# Patient Record
Sex: Female | Born: 1935 | Race: White | Hispanic: No | Smoking: Never smoker
Health system: Southern US, Community
[De-identification: ages and names within clinical notes are randomized; demographics above are authoritative.]

## PROBLEM LIST (undated history)

## (undated) DIAGNOSIS — E7800 Pure hypercholesterolemia, unspecified: Secondary | ICD-10-CM

## (undated) DIAGNOSIS — M1990 Unspecified osteoarthritis, unspecified site: Secondary | ICD-10-CM

## (undated) DIAGNOSIS — H409 Unspecified glaucoma: Secondary | ICD-10-CM

## (undated) DIAGNOSIS — J302 Other seasonal allergic rhinitis: Secondary | ICD-10-CM

## (undated) DIAGNOSIS — I10 Essential (primary) hypertension: Secondary | ICD-10-CM

## (undated) DIAGNOSIS — H269 Unspecified cataract: Secondary | ICD-10-CM

## (undated) HISTORY — PX: FACIAL COSMETIC SURGERY: SHX629

---

## 2009-10-19 ENCOUNTER — Ambulatory Visit: Payer: Self-pay | Admitting: Family Medicine

## 2010-10-24 ENCOUNTER — Ambulatory Visit: Payer: Self-pay | Admitting: Family Medicine

## 2010-11-02 ENCOUNTER — Ambulatory Visit: Payer: Self-pay | Admitting: Family Medicine

## 2010-11-07 ENCOUNTER — Ambulatory Visit: Payer: Self-pay

## 2011-07-23 ENCOUNTER — Ambulatory Visit: Payer: Self-pay | Admitting: Internal Medicine

## 2011-07-23 IMAGING — CR RIGHT ANKLE - COMPLETE 3+ VIEW
1 series · 5 of 5 positions shown · non-contrast
Comparison: none

REASON FOR EXAM: pain with ambulation
COMMENTS:

PROCEDURE:     MDR - MDR ANKLE RIGHT COMPLETE  - [DATE] [DATE]
RESULT:     Comparison: None.

[Series 1: ap · 0.17mm/px · 5 of 5 slices shown]
[im 1/5]
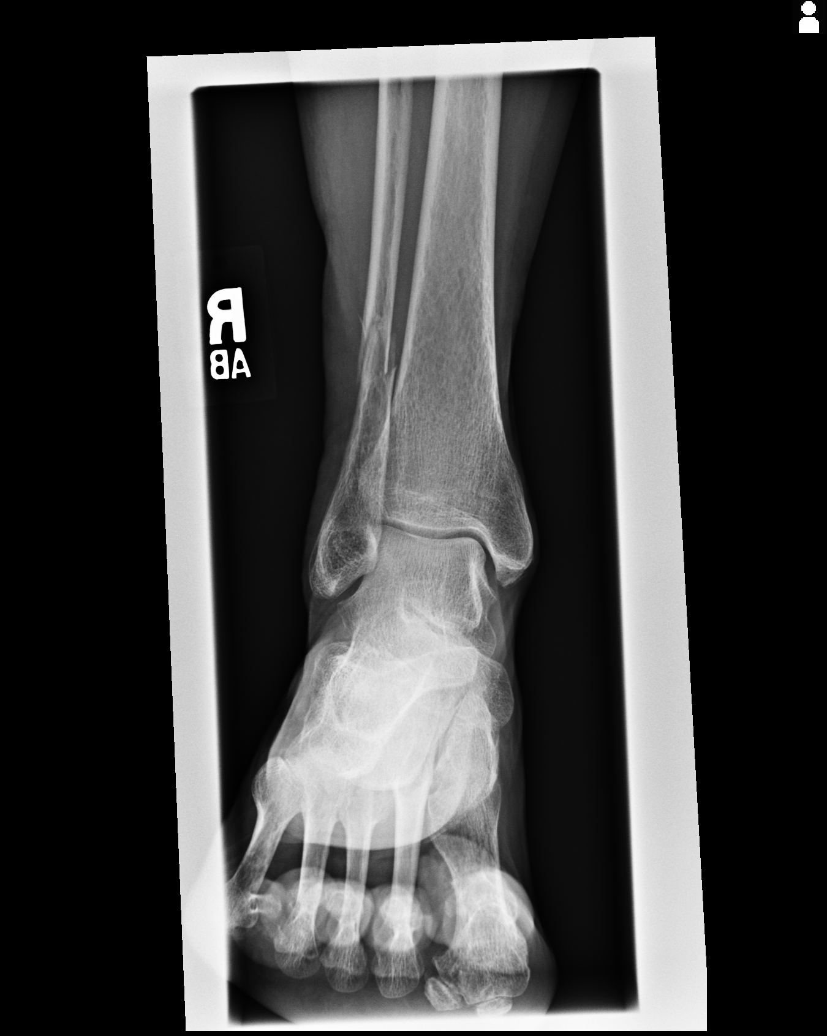
[im 2/5]
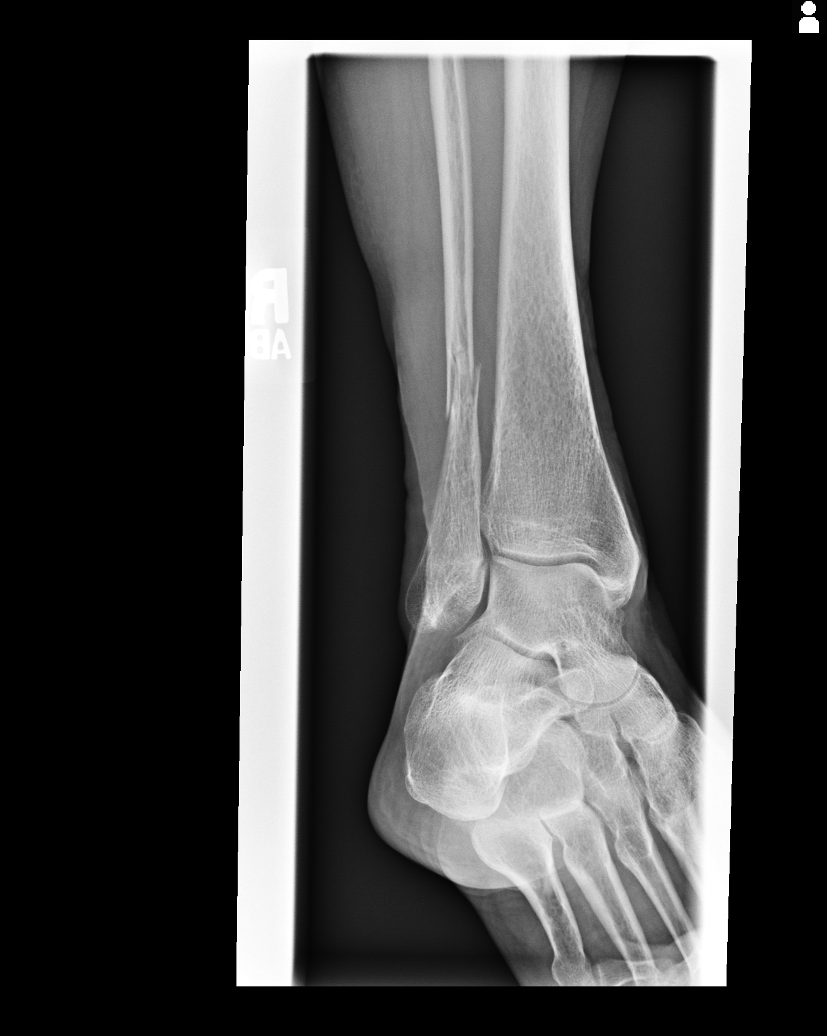
[im 3/5]
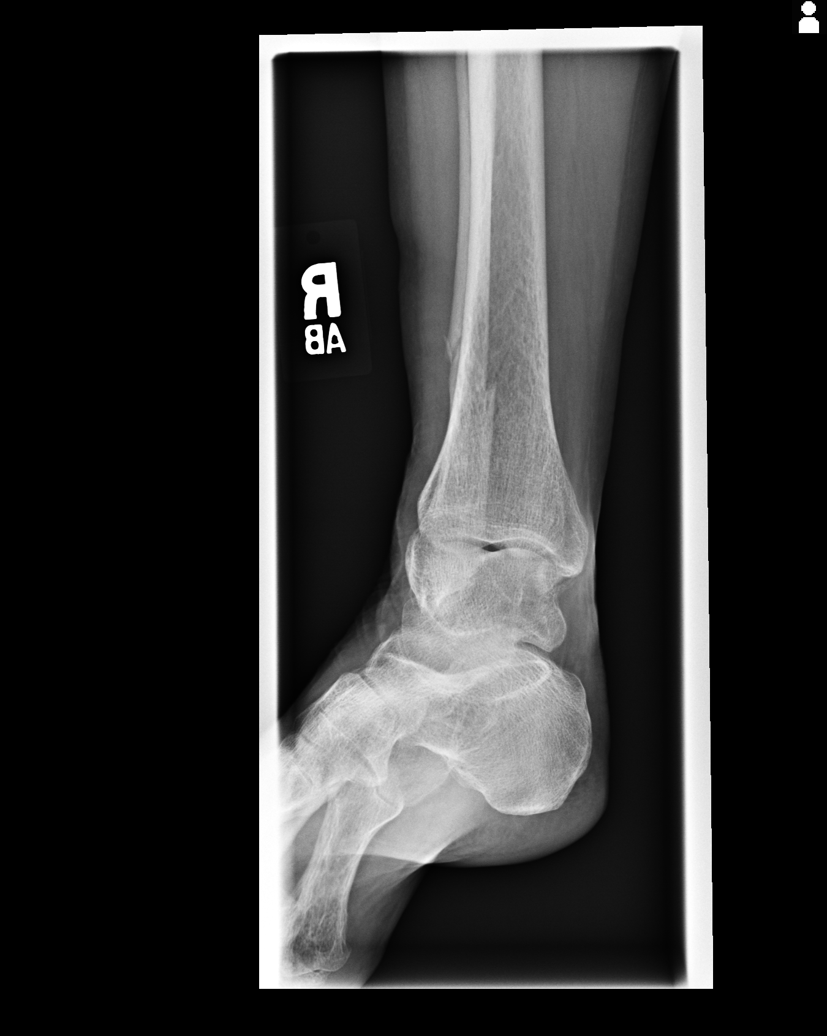
[im 4/5]
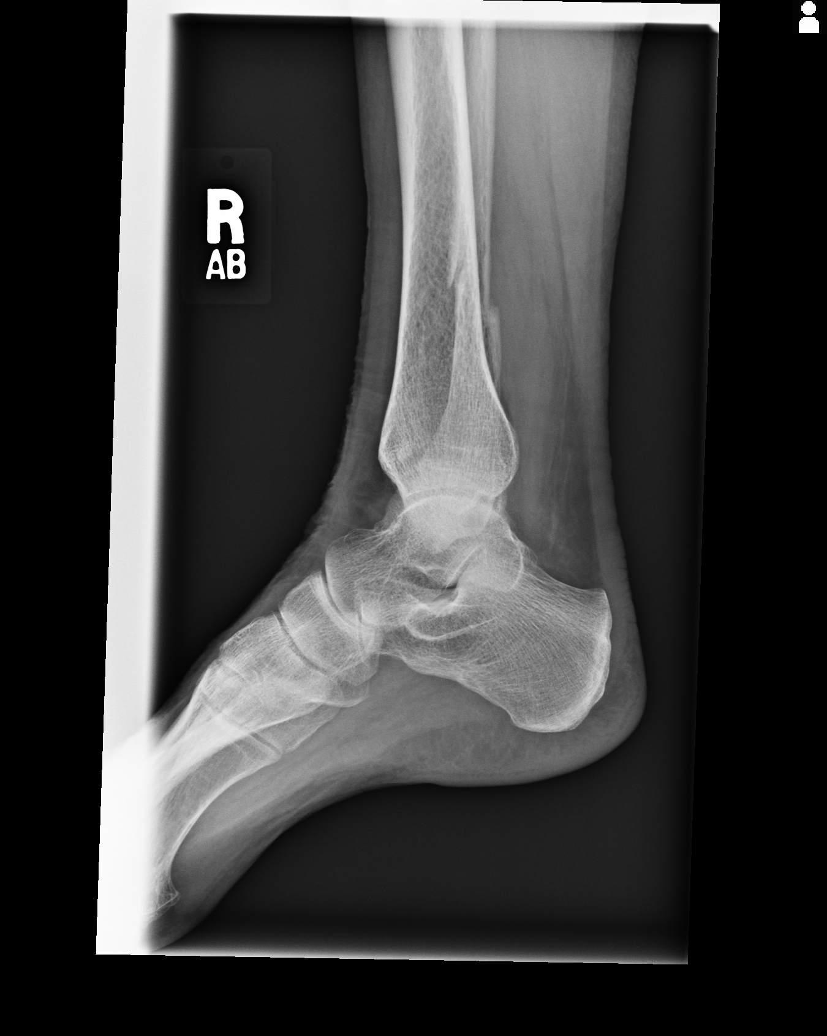
[im 5/5]
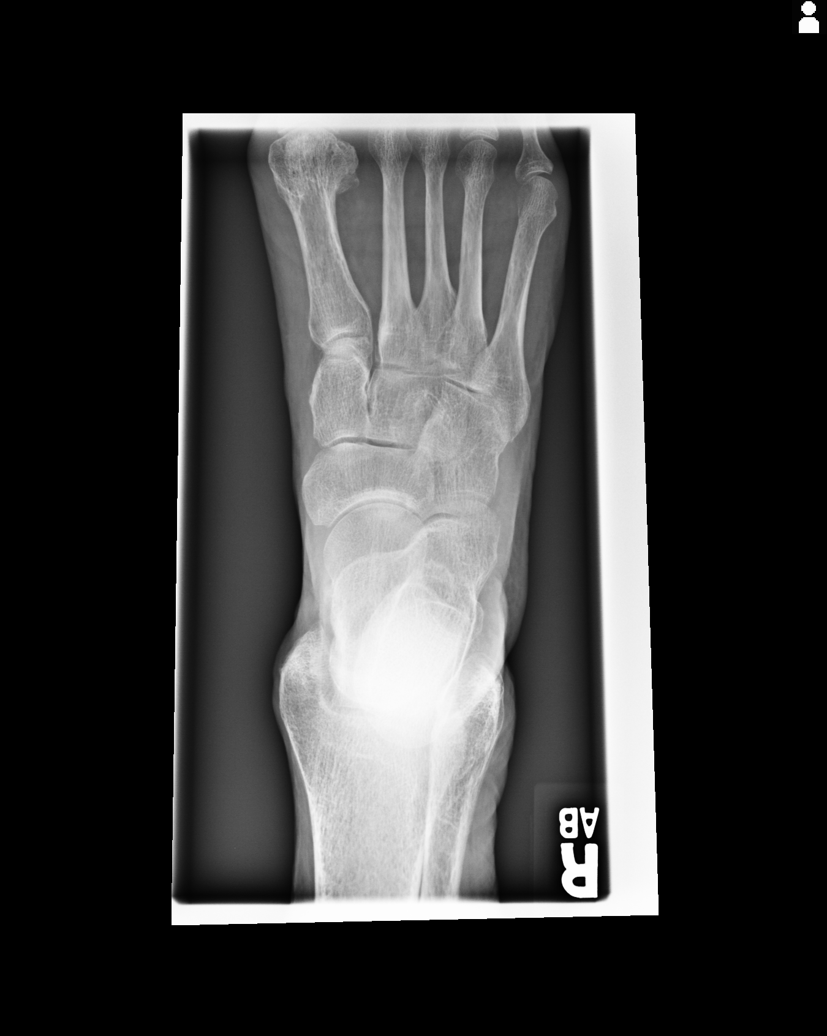

[5 of 5 positions shown; findings below may reference images not displayed]

FINDINGS: There is a mildly displaced fracture of the distal fibular diaphysis. No
fracture seen in the distal tibia.
IMPRESSION: Mildly displaced fracture of the distal fibula.

[REDACTED]

## 2011-11-22 ENCOUNTER — Ambulatory Visit: Payer: Self-pay | Admitting: Family Medicine

## 2011-11-22 IMAGING — MG MM DIGITAL SCREENING BILAT W/ CAD
1 series · 4 of 4 positions shown · non-contrast
Comparison: none

REASON FOR EXAM: SCR MAMMO NO ORDER
COMMENTS:

PROCEDURE:     MMM - MMM DGT SCR NO ORDER W/CAD  - November 22, 2011  [DATE]
RESULT:
Comparisons: 11/02/2010 and 10/19/2009.

[R CC · right · 4 of 4 slices shown]
[im 1/4]
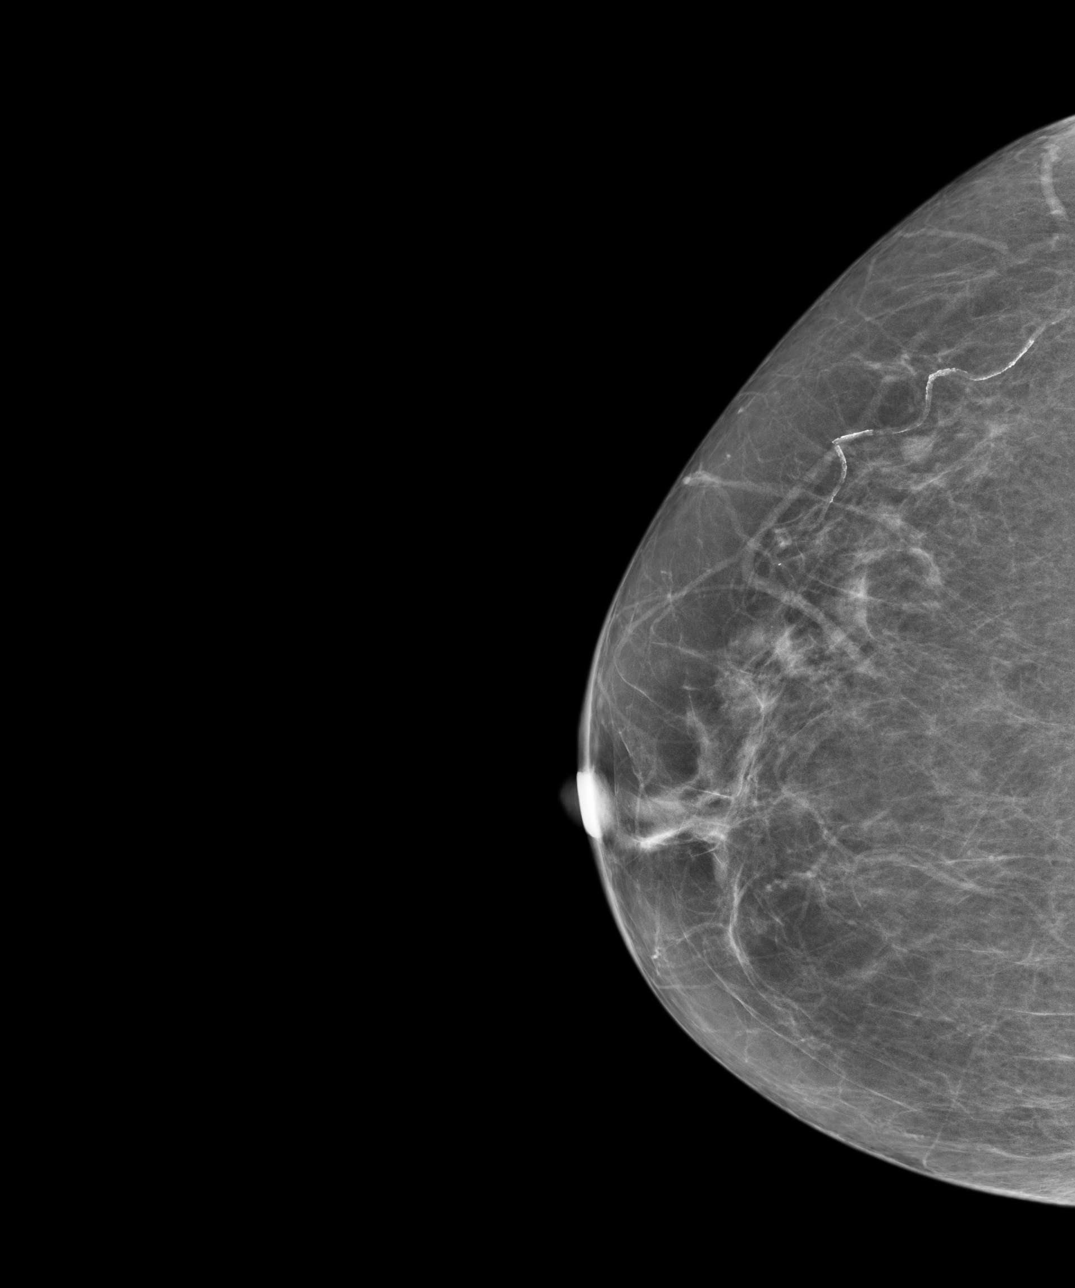
[im 2/4]
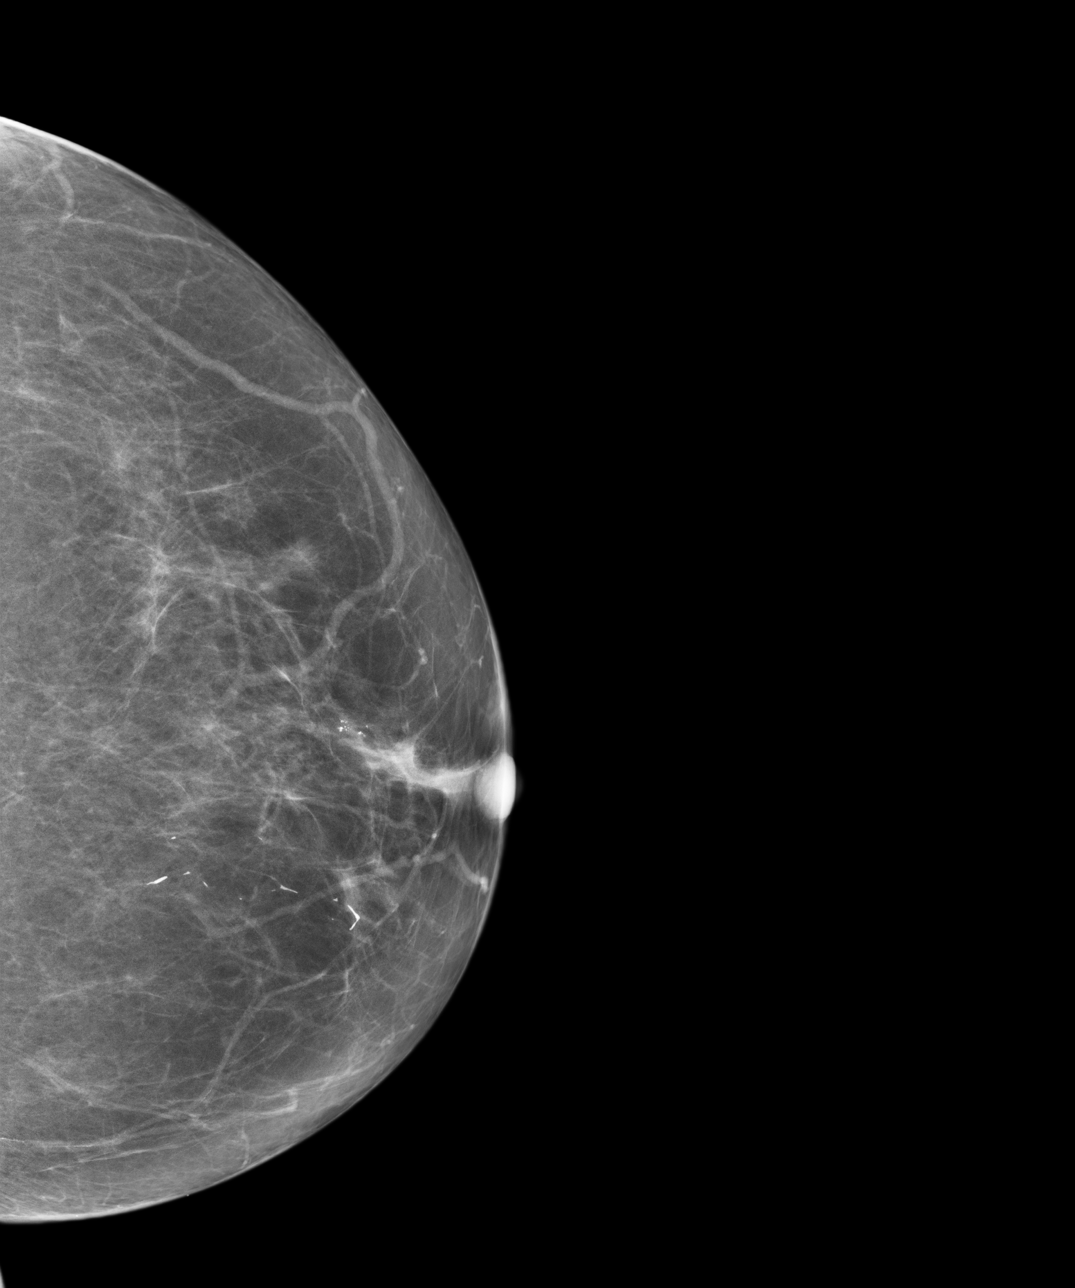
[im 3/4]
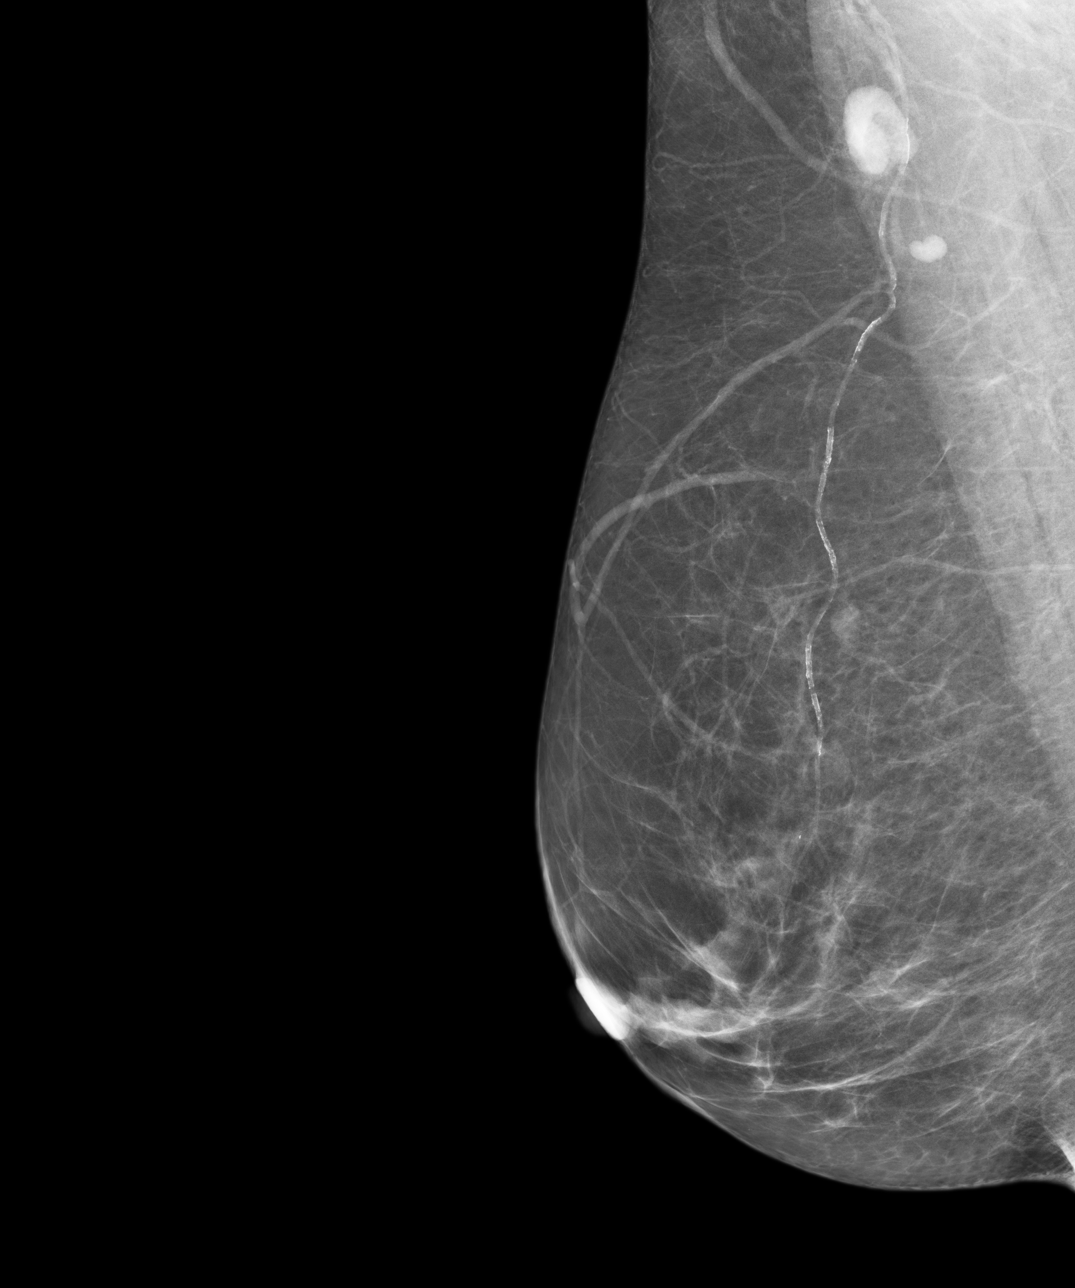
[im 4/4]
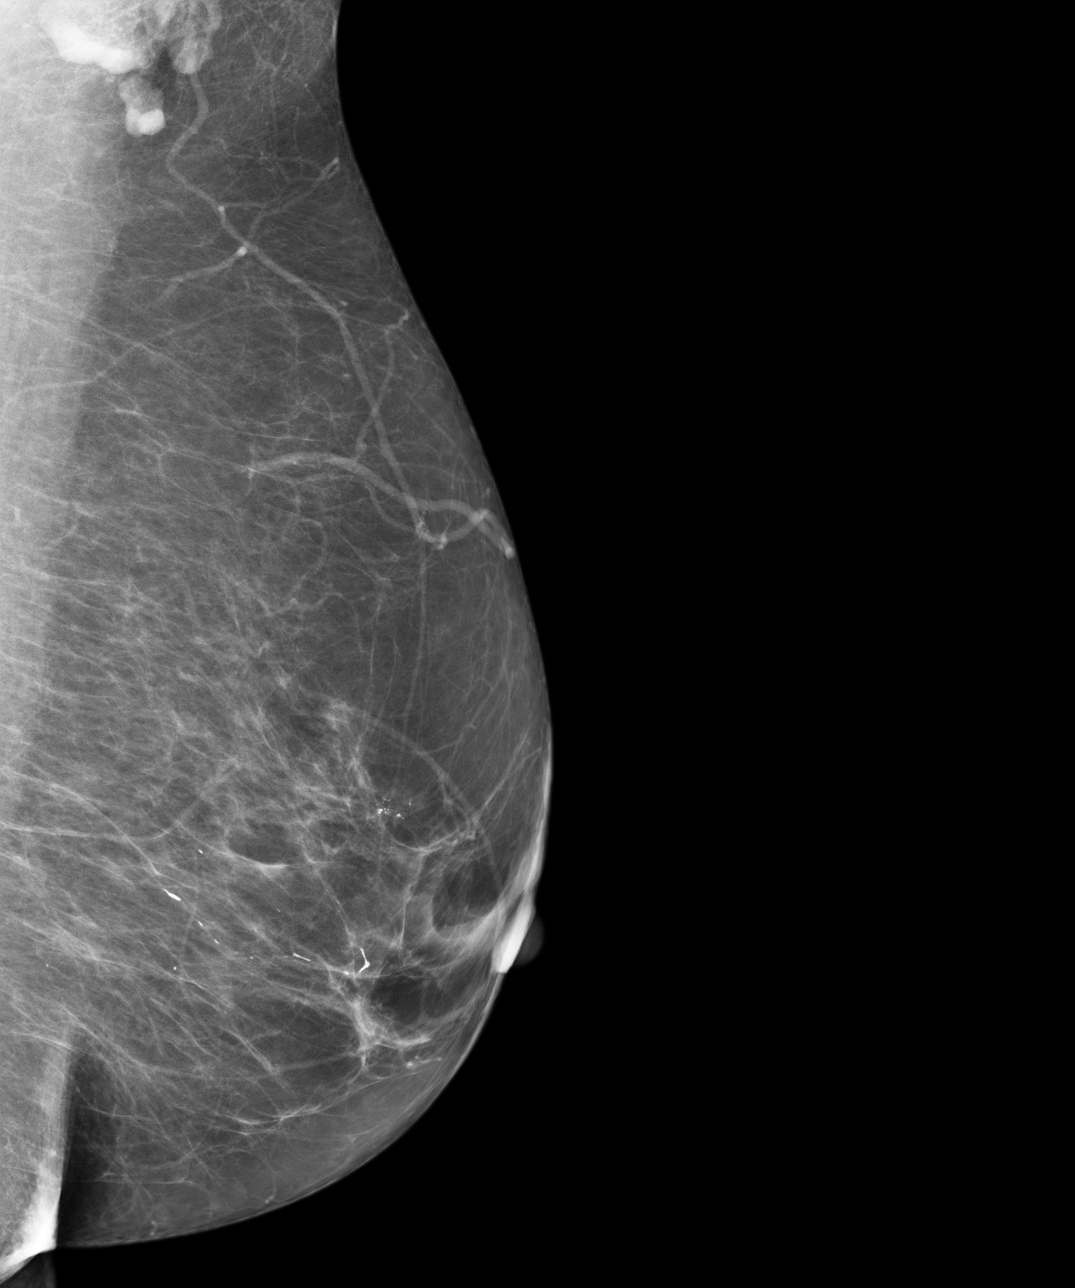

[4 of 4 positions shown; findings below may reference images not displayed]

FINDINGS: There is scattered fibroglandular tissue. Small round mass in the
superolateral aspect of the right breast is similar to prior studies and may
represent a small lymph node or cyst. Small cluster of round
microcalcifications in the superior aspect of the left breast is also
similar to multiple prior studies. No suspicious masses or calcifications
are identified.
IMPRESSION: BI-RADS: Category 2 - Benign Finding.

Recommend continued annual screening mammography.

A NEGATIVE MAMMOGRAM REPORT DOES NOT PRECLUDE BIOPSY OR OTHER EVALUATION OF
A CLINICALLY PALPABLE OR OTHERWISE SUSPICIOUS MASS OR LESION. BREAST CANCER
MAY NOT BE DETECTED BY MAMMOGRAPHY IN UP TO 10% OF CASES.

## 2013-01-22 ENCOUNTER — Ambulatory Visit: Payer: Self-pay | Admitting: Family Medicine

## 2014-05-30 ENCOUNTER — Other Ambulatory Visit: Payer: Self-pay | Admitting: Family Medicine

## 2014-05-30 DIAGNOSIS — Z1231 Encounter for screening mammogram for malignant neoplasm of breast: Secondary | ICD-10-CM

## 2014-06-09 ENCOUNTER — Ambulatory Visit
Admission: RE | Admit: 2014-06-09 | Discharge: 2014-06-09 | Disposition: A | Discharge status: Home / Self Care | Payer: Medicare Other | Source: Ambulatory Visit | Attending: Family Medicine | Admitting: Family Medicine

## 2014-06-09 DIAGNOSIS — Z1231 Encounter for screening mammogram for malignant neoplasm of breast: Secondary | ICD-10-CM | POA: Insufficient documentation

## 2014-08-12 ENCOUNTER — Other Ambulatory Visit: Payer: Self-pay | Admitting: Family Medicine

## 2014-08-12 DIAGNOSIS — Z780 Asymptomatic menopausal state: Secondary | ICD-10-CM

## 2014-08-12 DIAGNOSIS — E349 Endocrine disorder, unspecified: Secondary | ICD-10-CM

## 2015-01-20 ENCOUNTER — Ambulatory Visit
Admission: EM | Admit: 2015-01-20 | Discharge: 2015-01-20 | Disposition: A | Discharge status: Home / Self Care | Payer: Medicare Other | Attending: Family Medicine | Admitting: Family Medicine

## 2015-01-20 ENCOUNTER — Encounter: Payer: Self-pay | Admitting: *Deleted

## 2015-01-20 DIAGNOSIS — B354 Tinea corporis: Secondary | ICD-10-CM | POA: Diagnosis not present

## 2015-01-20 HISTORY — DX: Unspecified cataract: H26.9

## 2015-01-20 HISTORY — DX: Essential (primary) hypertension: I10

## 2015-01-20 HISTORY — DX: Unspecified glaucoma: H40.9

## 2015-01-20 MED ORDER — FLUCONAZOLE 150 MG PO TABS: ORAL_TABLET | ORAL | Status: DC

## 2015-01-20 MED ORDER — AMOXICILLIN 875 MG PO TABS: 875.0000 mg | ORAL_TABLET | Freq: Two times a day (BID) | ORAL | Status: DC

## 2015-01-20 NOTE — ED Notes (Addendum)
Patient noticed a rash developing on her upper chest yesterday. A red rash is visible above breast and on neck. No rash present on back arms face or legs. The rash is itchy. Patient has a history of skin issues. No SOB or throat swelling reported.

## 2015-01-20 NOTE — Discharge Instructions (Signed)

## 2015-01-20 NOTE — ED Provider Notes (Signed)
CSN: [id number]     Arrival date & time 01/20/15  1346 History   None    Chief Complaint  Patient presents with  . Rash   (Consider location/radiation/quality/duration/timing/severity/associated sxs/prior Treatment) Patient is a 81 y.o. female presenting with rash. The history is provided by the patient.  Rash Location:  Torso and head/neck Head/neck rash location:  L neck and R neck Torso rash location:  L chest and R chest Quality: itchiness and redness   Quality: not blistering, not bruising, not burning, not draining and not painful   Severity:  Moderate Onset quality:  Sudden Duration:  2 days Timing:  Constant Progression:  Worsening Chronicity:  New Context: not animal contact, not chemical exposure, not eggs, not exposure to similar rash, not hot tub use, not insect bite/sting, not medications, not new detergent/soap, not nuts, not plant contact, not sick contacts and not sun exposure   Relieved by:  Nothing Ineffective treatments:  Anti-itch cream Associated symptoms: no abdominal pain, no diarrhea, no fever, no headaches, no nausea, no periorbital edema, no shortness of breath, no sore throat, no throat swelling, no tongue swelling, no URI and not wheezing     Past Medical History  Diagnosis Date  . Hypertension   . Cataract   . Glaucoma    History reviewed. No pertinent past surgical history. History reviewed. No pertinent family history. Social History  Substance Use Topics  . Smoking status: Never Smoker   . Smokeless tobacco: Never Used  . Alcohol Use: Yes   OB History    No data available     Review of Systems  Constitutional: Negative for fever.  HENT: Negative for sore throat.   Respiratory: Negative for shortness of breath and wheezing.   Gastrointestinal: Negative for nausea, abdominal pain and diarrhea.  Skin: Positive for rash.  Neurological: Negative for headaches.    Allergies  Sulfa antibiotics  Home Medications   Prior to Admission  medications   Medication Sig Start Date End Date Taking? Authorizing Provider  lisinopril (PRINIVIL,ZESTRIL) 20 MG tablet Take 20 mg by mouth daily.   Yes Historical Provider, MD  amoxicillin (AMOXIL) 875 MG tablet Take 1 tablet (875 mg total) by mouth 2 (two) times daily. 01/20/15   Payton Mccallumrlando Shalae Belmonte, MD  fluconazole (DIFLUCAN) 150 MG tablet Take 1 tab po once today. May repeat in 1 week. 01/20/15   Payton Mccallumrlando Laelah Siravo, MD   Meds Ordered and Administered this Visit  Medications - No data to display  BP 169/74 mmHg  Pulse 85  Temp(Src) 98 F (36.7 C) (Oral)  Resp 20  Ht 5\' 3"  (1.6 m)  Wt 111 lb (50.349 kg)  BMI 19.67 kg/m2  SpO2 100% No data found.   Physical Exam  Constitutional: She appears well-developed and well-nourished. No distress.  Skin: Rash (diffuse, erythematous, macular, papular rash on anterior neck and upper chest area) noted. She is not diaphoretic.  Nursing note and vitals reviewed.   ED Course  Procedures (including critical care time)  Labs Review Labs Reviewed - No data to display  Imaging Review No results found.   Visual Acuity Review  Right Eye Distance:   Left Eye Distance:   Bilateral Distance:    Right Eye Near:   Left Eye Near:    Bilateral Near:         MDM   1. Tinea corporis    Discharge Medication List as of 01/20/2015  3:15 PM    START taking these medications   Details  fluconazole (DIFLUCAN) 150 MG tablet Take 1 tab po once today. May repeat in 1 week., Normal       1. diagnosis reviewed with patient 2. rx as per orders above; reviewed possible side effects, interactions, risks and benefits  3. Recommend supportive treatment with otc antifungal cream and otc anti-histamines prn 4. Follow-up prn if symptoms worsen or don't improve    Payton Mccallum, MD 01/20/15 1704

## 2015-03-10 ENCOUNTER — Encounter: Payer: Self-pay | Admitting: *Deleted

## 2015-03-13 ENCOUNTER — Encounter: Payer: Self-pay | Admitting: Anesthesiology

## 2015-03-20 ENCOUNTER — Ambulatory Visit: Admission: RE | Admit: 2015-03-20 | Payer: Medicare Other | Source: Ambulatory Visit | Admitting: Ophthalmology

## 2015-03-20 HISTORY — DX: Other seasonal allergic rhinitis: J30.2

## 2015-03-20 HISTORY — DX: Unspecified osteoarthritis, unspecified site: M19.90

## 2015-03-20 SURGERY — PHACOEMULSIFICATION, CATARACT, WITH IOL INSERTION
Anesthesia: Topical | Laterality: Right

## 2015-08-30 ENCOUNTER — Other Ambulatory Visit: Payer: Self-pay | Admitting: Family Medicine

## 2015-08-30 DIAGNOSIS — Z1231 Encounter for screening mammogram for malignant neoplasm of breast: Secondary | ICD-10-CM

## 2015-09-21 ENCOUNTER — Other Ambulatory Visit: Payer: Self-pay | Admitting: Family Medicine

## 2015-09-21 ENCOUNTER — Ambulatory Visit
Admission: RE | Admit: 2015-09-21 | Discharge: 2015-09-21 | Disposition: A | Discharge status: Home / Self Care | Payer: Medicare Other | Source: Ambulatory Visit | Attending: Family Medicine | Admitting: Family Medicine

## 2015-09-21 DIAGNOSIS — Z1231 Encounter for screening mammogram for malignant neoplasm of breast: Secondary | ICD-10-CM | POA: Diagnosis not present

## 2016-03-12 ENCOUNTER — Encounter: Payer: Self-pay | Admitting: *Deleted

## 2016-03-15 NOTE — Discharge Instructions (Signed)
Cataract Surgery, Care After °Refer to this sheet in the next few weeks. These instructions provide you with information about caring for yourself after your procedure. Your health care provider may also give you more specific instructions. Your treatment has been planned according to current medical practices, but problems sometimes occur. Call your health care provider if you have any problems or questions after your procedure. °What can I expect after the procedure? °After the procedure, it is common to have: °· Itching. °· Discomfort. °· Fluid discharge. °· Sensitivity to light and to touch. °· Bruising. °Follow these instructions at home: °Eye Care  °· Check your eye every day for signs of infection. Watch for: °¨ Redness, swelling, or pain. °¨ Fluid, blood, or pus. °¨ Warmth. °¨ Bad smell. °Activity  °· Avoid strenuous activities, such as playing contact sports, for as long as told by your health care provider. °· Do not drive or operate heavy machinery until your health care provider approves. °· Do not bend or lift heavy objects . Bending increases pressure in the eye. You can walk, climb stairs, and do light household chores. °· Ask your health care provider when you can return to work. If you work in a dusty environment, you may be advised to wear protective eyewear for a period of time. °General instructions  °· Take or apply over-the-counter and prescription medicines only as told by your health care provider. This includes eye drops. °· Do not touch or rub your eyes. °· If you were given a protective shield, wear it as told by your health care provider. If you were not given a protective shield, wear sunglasses as told by your health care provider to protect your eyes. °· Keep the area around your eye clean and dry. Avoid swimming or allowing water to hit you directly in the face while showering until told by your health care provider. Keep soap and shampoo out of your eyes. °· Do not put a contact lens  into the affected eye or eyes until your health care provider approves. °· Keep all follow-up visits as told by your health care provider. This is important. °Contact a health care provider if: ° °· You have increased bruising around your eye. °· You have pain that is not helped with medicine. °· You have a fever. °· You have redness, swelling, or pain in your eye. °· You have fluid, blood, or pus coming from your incision. °· Your vision gets worse. °Get help right away if: °· You have sudden vision loss. °This information is not intended to replace advice given to you by your health care provider. Make sure you discuss any questions you have with your health care provider. °Document Released: 07/13/2004 Document Revised: 05/04/2015 Document Reviewed: 11/03/2014 °Elsevier Interactive Patient Education © 2017 [employer] ° ° ° ° °General Anesthesia, Adult, Care After °These instructions provide you with information about caring for yourself after your procedure. Your health care provider may also give you more specific instructions. Your treatment has been planned according to current medical practices, but problems sometimes occur. Call your health care provider if you have any problems or questions after your procedure. °What can I expect after the procedure? °After the procedure, it is common to have: °· Vomiting. °· A sore throat. °· Mental slowness. °It is common to feel: °· Nauseous. °· Cold or shivery. °· Sleepy. °· Tired. °· Sore or achy, even in parts of your body where you did not have surgery. °Follow these instructions at   home: °For at least 24 hours after the procedure:  °· Do not: °¨ Participate in activities where you could fall or become injured. °¨ Drive. °¨ Use heavy machinery. °¨ Drink alcohol. °¨ Take sleeping pills or medicines that cause drowsiness. °¨ Make important decisions or sign legal documents. °¨ Take care of children on your own. °· Rest. °Eating and drinking  °· If you vomit, drink  water, juice, or soup when you can drink without vomiting. °· Drink enough fluid to keep your urine clear or pale yellow. °· Make sure you have little or no nausea before eating solid foods. °· Follow the diet recommended by your health care provider. °General instructions  °· Have a responsible adult stay with you until you are awake and alert. °· Return to your normal activities as told by your health care provider. Ask your health care provider what activities are safe for you. °· Take over-the-counter and prescription medicines only as told by your health care provider. °· If you smoke, do not smoke without supervision. °· Keep all follow-up visits as told by your health care provider. This is important. °Contact a health care provider if: °· You continue to have nausea or vomiting at home, and medicines are not helpful. °· You cannot drink fluids or start eating again. °· You cannot urinate after 8-12 hours. °· You develop a skin rash. °· You have fever. °· You have increasing redness at the site of your procedure. °Get help right away if: °· You have difficulty breathing. °· You have chest pain. °· You have unexpected bleeding. °· You feel that you are having a life-threatening or urgent problem. °This information is not intended to replace advice given to you by your health care provider. Make sure you discuss any questions you have with your health care provider. °Document Released: 04/01/2000 Document Revised: 05/29/2015 Document Reviewed: 12/08/2014 °Elsevier Interactive Patient Education © 2017 [employer] ° °

## 2016-03-18 ENCOUNTER — Ambulatory Visit: Payer: Medicare Other | Admitting: Anesthesiology

## 2016-03-18 ENCOUNTER — Encounter
Admission: RE | Disposition: A | Discharge status: Home / Self Care | Payer: Self-pay | Source: Ambulatory Visit | Attending: Ophthalmology

## 2016-03-18 ENCOUNTER — Ambulatory Visit
Admission: RE | Admit: 2016-03-18 | Discharge: 2016-03-18 | Disposition: A | Discharge status: Home / Self Care | Payer: Medicare Other | Source: Ambulatory Visit | Attending: Ophthalmology | Admitting: Ophthalmology

## 2016-03-18 DIAGNOSIS — H2512 Age-related nuclear cataract, left eye: Secondary | ICD-10-CM | POA: Diagnosis not present

## 2016-03-18 DIAGNOSIS — I10 Essential (primary) hypertension: Secondary | ICD-10-CM | POA: Diagnosis not present

## 2016-03-18 HISTORY — PX: CATARACT EXTRACTION W/PHACO: SHX586

## 2016-03-18 HISTORY — DX: Pure hypercholesterolemia, unspecified: E78.00

## 2016-03-18 SURGERY — PHACOEMULSIFICATION, CATARACT, WITH IOL INSERTION
Anesthesia: Monitor Anesthesia Care | Site: Eye | Laterality: Left | Wound class: Clean

## 2016-03-18 MED ORDER — ACETAMINOPHEN 325 MG PO TABS: 325.0000 mg | ORAL_TABLET | ORAL | Status: DC | PRN

## 2016-03-18 MED ORDER — BSS IO SOLN
INTRAOCULAR | Status: DC | PRN
  Administered 2016-03-18: 104 mL via OPHTHALMIC

## 2016-03-18 MED ORDER — BRIMONIDINE TARTRATE-TIMOLOL 0.2-0.5 % OP SOLN
OPHTHALMIC | Status: DC | PRN
  Administered 2016-03-18: 1 [drp] via OPHTHALMIC

## 2016-03-18 MED ORDER — NA HYALUR & NA CHOND-NA HYALUR 0.4-0.35 ML IO KIT
PACK | INTRAOCULAR | Status: DC | PRN
  Administered 2016-03-18: 1 mL via INTRAOCULAR

## 2016-03-18 MED ORDER — MIDAZOLAM HCL 2 MG/2ML IJ SOLN
INTRAMUSCULAR | Status: DC | PRN
  Administered 2016-03-18: 2 mg via INTRAVENOUS

## 2016-03-18 MED ORDER — ACETAMINOPHEN 160 MG/5ML PO SOLN: 325.0000 mg | ORAL | Status: DC | PRN

## 2016-03-18 MED ORDER — CEFUROXIME OPHTHALMIC INJECTION 1 MG/0.1 ML
INJECTION | OPHTHALMIC | Status: DC | PRN
  Administered 2016-03-18: .2 mL via INTRACAMERAL

## 2016-03-18 MED ORDER — ARMC OPHTHALMIC DILATING DROPS
1.0000 "application " | OPHTHALMIC | Status: DC | PRN
  Administered 2016-03-18 (×3): 1 via OPHTHALMIC

## 2016-03-18 MED ORDER — MOXIFLOXACIN HCL 0.5 % OP SOLN
1.0000 [drp] | OPHTHALMIC | Status: DC | PRN
  Administered 2016-03-18 (×3): 1 [drp] via OPHTHALMIC

## 2016-03-18 MED ORDER — LIDOCAINE HCL (PF) 2 % IJ SOLN
INTRAMUSCULAR | Status: DC | PRN
  Administered 2016-03-18: 1 mL

## 2016-03-18 MED ORDER — FENTANYL CITRATE (PF) 100 MCG/2ML IJ SOLN
INTRAMUSCULAR | Status: DC | PRN
  Administered 2016-03-18: 50 ug via INTRAVENOUS

## 2016-03-18 SURGICAL SUPPLY — 23 items
APPLICATOR COTTON TIP 3IN (MISCELLANEOUS) ×2 IMPLANT
CANNULA ANT/CHMB 27GA (MISCELLANEOUS) ×2 IMPLANT
DISSECTOR HYDRO NUCLEUS 50X22 (MISCELLANEOUS) ×2 IMPLANT
GLOVE BIO SURGEON STRL SZ7 (GLOVE) ×2 IMPLANT
GLOVE SURG LX 6.5 MICRO (GLOVE) ×1
GLOVE SURG LX STRL 6.5 MICRO (GLOVE) ×1 IMPLANT
GOWN STRL REUS W/ TWL LRG LVL3 (GOWN DISPOSABLE) ×2 IMPLANT
GOWN STRL REUS W/TWL LRG LVL3 (GOWN DISPOSABLE) ×2
LENS IOL ACRSF IQ ULTRA 17.5 (Intraocular Lens) ×1 IMPLANT
LENS IOL ACRYSOF IQ 17.5 (Intraocular Lens) ×2 IMPLANT
MARKER SKIN DUAL TIP RULER LAB (MISCELLANEOUS) ×2 IMPLANT
PACK CATARACT BRASINGTON (MISCELLANEOUS) ×2 IMPLANT
PACK EYE AFTER SURG (MISCELLANEOUS) ×2 IMPLANT
PACK OPTHALMIC (MISCELLANEOUS) ×2 IMPLANT
RING MALYGIN 7.0 (MISCELLANEOUS) IMPLANT
SUT ETHILON 10-0 CS-B-6CS-B-6 (SUTURE)
SUT VICRYL  9 0 (SUTURE)
SUT VICRYL 9 0 (SUTURE) IMPLANT
SUTURE EHLN 10-0 CS-B-6CS-B-6 (SUTURE) IMPLANT
SYR TB 1ML LUER SLIP (SYRINGE) ×2 IMPLANT
WATER STERILE IRR 250ML POUR (IV SOLUTION) ×2 IMPLANT
WICK EYE OCUCEL (MISCELLANEOUS) IMPLANT
WIPE NON LINTING 3.25X3.25 (MISCELLANEOUS) ×2 IMPLANT

## 2016-03-18 NOTE — Anesthesia Preprocedure Evaluation (Signed)
Anesthesia Evaluation  Patient identified by MRN, date of birth, ID band Patient awake    Reviewed: Allergy & Precautions, H&P , NPO status , Patient's Chart, lab work & pertinent test results, reviewed documented beta blocker date and time   Airway Mallampati: II  TM Distance: >3 FB Neck ROM: full    Dental no notable dental hx.    Pulmonary neg pulmonary ROS,    Pulmonary exam normal breath sounds clear to auscultation       Cardiovascular Exercise Tolerance: Good hypertension,  Rhythm:regular Rate:Normal     Neuro/Psych negative neurological ROS  negative psych ROS   GI/Hepatic negative GI ROS, Neg liver ROS,   Endo/Other  negative endocrine ROS  Renal/GU negative Renal ROS  negative genitourinary   Musculoskeletal   Abdominal   Peds  Hematology negative hematology ROS (+)   Anesthesia Other Findings   Reproductive/Obstetrics negative OB ROS                             Anesthesia Physical Anesthesia Plan  ASA: II  Anesthesia Plan: MAC   Post-op Pain Management:    Induction:   Airway Management Planned:   Additional Equipment:   Intra-op Plan:   Post-operative Plan:   Informed Consent: I have reviewed the patients History and Physical, chart, labs and discussed the procedure including the risks, benefits and alternatives for the proposed anesthesia with the patient or authorized representative who has indicated his/her understanding and acceptance.   Dental Advisory Given  Plan Discussed with: CRNA  Anesthesia Plan Comments:         Anesthesia Quick Evaluation  

## 2016-03-18 NOTE — H&P (Signed)
H+P reviewed and is up to date, please see paper chart.  

## 2016-03-18 NOTE — Anesthesia Procedure Notes (Signed)
Procedure Name: MAC Performed by: Ambre Kobayashi Pre-anesthesia Checklist: Patient identified, Emergency Drugs available, Suction available, Timeout performed and Patient being monitored Patient Re-evaluated:Patient Re-evaluated prior to inductionOxygen Delivery Method: Nasal cannula Placement Confirmation: positive ETCO2     

## 2016-03-18 NOTE — Anesthesia Postprocedure Evaluation (Signed)
Anesthesia Post Note  Patient: Barbara Berg  Procedure(s) Performed: Procedure(s) (LRB): CATARACT EXTRACTION PHACO AND INTRAOCULAR LENS PLACEMENT (IOC)  left eye (Left)  Patient location during evaluation: PACU Anesthesia Type: MAC Level of consciousness: awake and alert Pain management: pain level controlled Vital Signs Assessment: post-procedure vital signs reviewed and stable Respiratory status: spontaneous breathing, nonlabored ventilation, respiratory function stable and patient connected to nasal cannula oxygen Cardiovascular status: stable and blood pressure returned to baseline Anesthetic complications: no    Alisa Graff

## 2016-03-18 NOTE — Transfer of Care (Signed)
Immediate Anesthesia Transfer of Care Note  Patient: Barbara Berg  Procedure(s) Performed: Procedure(s): CATARACT EXTRACTION PHACO AND INTRAOCULAR LENS PLACEMENT (IOC)  left eye (Left)  Patient Location: PACU  Anesthesia Type: MAC  Level of Consciousness: awake, alert  and patient cooperative  Airway and Oxygen Therapy: Patient Spontanous Breathing and Patient connected to supplemental oxygen  Post-op Assessment: Post-op Vital signs reviewed, Patient's Cardiovascular Status Stable, Respiratory Function Stable, Patent Airway and No signs of Nausea or vomiting  Post-op Vital Signs: Reviewed and stable  Complications: No apparent anesthesia complications

## 2016-03-18 NOTE — Op Note (Signed)
Date of Surgery: 03/18/2016  PREOPERATIVE DIAGNOSES: Visually significant nuclear sclerotic cataract, left eye.  POSTOPERATIVE DIAGNOSES: Same  PROCEDURES PERFORMED: Cataract extraction with intraocular lens implant, left eye.  SURGEON: Almon Hercules, M.D.  ANESTHESIA: MAC and topical  IMPLANTS: [id number] +17.5 D  Implant Name Type Inv. Item Serial No. Manufacturer Lot No. LRB No. Used  LENS IOL ACRYSOF IQ 17.5 - [id number] Intraocular Lens LENS IOL ACRYSOF IQ 17.5 [id number] [employer]   Left 1    COMPLICATIONS: None.  DESCRIPTION OF PROCEDURE: Therapeutic options were discussed with the patient preoperatively, including a discussion of risks and benefits of surgery. Informed consent was obtained. An IOL-Master and immersion biometry were used to take the lens measurements, and a dilated fundus exam was performed within 6 months of the surgical date.  The patient was premedicated and brought to the operating room and placed on the operating table in the supine position. After adequate anesthesia, the patient was prepped and draped in the usual sterile ophthalmic fashion. A wire lid speculum was inserted and the microscope was positioned. A Superblade was used to create a paracentesis site at the limbus and a small amount of dilute preservative free lidocaine was instilled into the anterior chamber, followed by dispersive viscoelastic. A clear corneal incision was created temporally using a 2.4 mm keratome blade. Capsulorrhexis was then performed. In situ phacoemulsification was performed.  Cortical material was removed with the irrigation-aspiration unit. Dispersive viscoelastic was instilled to open the capsular bag. A posterior chamber intraocular lens with the specifications above was inserted and positioned. Irrigation-aspiration was used to remove all viscoelastic. Cefuroxime 1cc was instilled into the anterior chamber, and the corneal incision was checked and found to be water tight. The  eyelid speculum was removed.  The operative eye was covered with protective goggles after instilling 1 drop of timolol and brimonidine. The patient tolerated the procedure well. There were no complications.

## 2016-03-19 ENCOUNTER — Encounter: Payer: Self-pay | Admitting: Ophthalmology

## 2016-04-03 ENCOUNTER — Encounter: Payer: Self-pay | Admitting: *Deleted

## 2016-04-04 NOTE — Discharge Instructions (Signed)
Cataract Surgery, Care After °Refer to this sheet in the next few weeks. These instructions provide you with information about caring for yourself after your procedure. Your health care provider may also give you more specific instructions. Your treatment has been planned according to current medical practices, but problems sometimes occur. Call your health care provider if you have any problems or questions after your procedure. °What can I expect after the procedure? °After the procedure, it is common to have: °· Itching. °· Discomfort. °· Fluid discharge. °· Sensitivity to light and to touch. °· Bruising. °Follow these instructions at home: °Eye Care  °· Check your eye every day for signs of infection. Watch for: °¨ Redness, swelling, or pain. °¨ Fluid, blood, or pus. °¨ Warmth. °¨ Bad smell. °Activity  °· Avoid strenuous activities, such as playing contact sports, for as long as told by your health care provider. °· Do not drive or operate heavy machinery until your health care provider approves. °· Do not bend or lift heavy objects . Bending increases pressure in the eye. You can walk, climb stairs, and do light household chores. °· Ask your health care provider when you can return to work. If you work in a dusty environment, you may be advised to wear protective eyewear for a period of time. °General instructions  °· Take or apply over-the-counter and prescription medicines only as told by your health care provider. This includes eye drops. °· Do not touch or rub your eyes. °· If you were given a protective shield, wear it as told by your health care provider. If you were not given a protective shield, wear sunglasses as told by your health care provider to protect your eyes. °· Keep the area around your eye clean and dry. Avoid swimming or allowing water to hit you directly in the face while showering until told by your health care provider. Keep soap and shampoo out of your eyes. °· Do not put a contact lens  into the affected eye or eyes until your health care provider approves. °· Keep all follow-up visits as told by your health care provider. This is important. °Contact a health care provider if: ° °· You have increased bruising around your eye. °· You have pain that is not helped with medicine. °· You have a fever. °· You have redness, swelling, or pain in your eye. °· You have fluid, blood, or pus coming from your incision. °· Your vision gets worse. °Get help right away if: °· You have sudden vision loss. °This information is not intended to replace advice given to you by your health care provider. Make sure you discuss any questions you have with your health care provider. °Document Released: 07/13/2004 Document Revised: 05/04/2015 Document Reviewed: 11/03/2014 °Elsevier Interactive Patient Education © 2017 [employer] ° ° ° ° °General Anesthesia, Adult, Care After °These instructions provide you with information about caring for yourself after your procedure. Your health care provider may also give you more specific instructions. Your treatment has been planned according to current medical practices, but problems sometimes occur. Call your health care provider if you have any problems or questions after your procedure. °What can I expect after the procedure? °After the procedure, it is common to have: °· Vomiting. °· A sore throat. °· Mental slowness. °It is common to feel: °· Nauseous. °· Cold or shivery. °· Sleepy. °· Tired. °· Sore or achy, even in parts of your body where you did not have surgery. °Follow these instructions at   home: °For at least 24 hours after the procedure:  °· Do not: °¨ Participate in activities where you could fall or become injured. °¨ Drive. °¨ Use heavy machinery. °¨ Drink alcohol. °¨ Take sleeping pills or medicines that cause drowsiness. °¨ Make important decisions or sign legal documents. °¨ Take care of children on your own. °· Rest. °Eating and drinking  °· If you vomit, drink  water, juice, or soup when you can drink without vomiting. °· Drink enough fluid to keep your urine clear or pale yellow. °· Make sure you have little or no nausea before eating solid foods. °· Follow the diet recommended by your health care provider. °General instructions  °· Have a responsible adult stay with you until you are awake and alert. °· Return to your normal activities as told by your health care provider. Ask your health care provider what activities are safe for you. °· Take over-the-counter and prescription medicines only as told by your health care provider. °· If you smoke, do not smoke without supervision. °· Keep all follow-up visits as told by your health care provider. This is important. °Contact a health care provider if: °· You continue to have nausea or vomiting at home, and medicines are not helpful. °· You cannot drink fluids or start eating again. °· You cannot urinate after 8-12 hours. °· You develop a skin rash. °· You have fever. °· You have increasing redness at the site of your procedure. °Get help right away if: °· You have difficulty breathing. °· You have chest pain. °· You have unexpected bleeding. °· You feel that you are having a life-threatening or urgent problem. °This information is not intended to replace advice given to you by your health care provider. Make sure you discuss any questions you have with your health care provider. °Document Released: 04/01/2000 Document Revised: 05/29/2015 Document Reviewed: 12/08/2014 °Elsevier Interactive Patient Education © 2017 [employer] ° °

## 2016-04-08 ENCOUNTER — Ambulatory Visit
Admission: RE | Admit: 2016-04-08 | Discharge: 2016-04-08 | Disposition: A | Discharge status: Home / Self Care | Payer: Medicare Other | Source: Ambulatory Visit | Attending: Ophthalmology | Admitting: Ophthalmology

## 2016-04-08 ENCOUNTER — Ambulatory Visit: Payer: Medicare Other | Admitting: Anesthesiology

## 2016-04-08 ENCOUNTER — Encounter
Admission: RE | Disposition: A | Discharge status: Home / Self Care | Payer: Self-pay | Source: Ambulatory Visit | Attending: Ophthalmology

## 2016-04-08 DIAGNOSIS — I10 Essential (primary) hypertension: Secondary | ICD-10-CM | POA: Insufficient documentation

## 2016-04-08 DIAGNOSIS — H2511 Age-related nuclear cataract, right eye: Secondary | ICD-10-CM | POA: Insufficient documentation

## 2016-04-08 HISTORY — PX: CATARACT EXTRACTION W/PHACO: SHX586

## 2016-04-08 SURGERY — PHACOEMULSIFICATION, CATARACT, WITH IOL INSERTION
Anesthesia: Monitor Anesthesia Care | Site: Eye | Laterality: Right | Wound class: Clean

## 2016-04-08 MED ORDER — NA HYALUR & NA CHOND-NA HYALUR 0.4-0.35 ML IO KIT
PACK | INTRAOCULAR | Status: DC | PRN
  Administered 2016-04-08: 1 mL via INTRAOCULAR

## 2016-04-08 MED ORDER — LIDOCAINE HCL (PF) 2 % IJ SOLN
INTRAMUSCULAR | Status: DC | PRN
  Administered 2016-04-08: 2 mL

## 2016-04-08 MED ORDER — TIMOLOL MALEATE 0.5 % OP SOLN
OPHTHALMIC | Status: DC | PRN
  Administered 2016-04-08: 1 [drp] via OPHTHALMIC

## 2016-04-08 MED ORDER — FENTANYL CITRATE (PF) 100 MCG/2ML IJ SOLN
INTRAMUSCULAR | Status: DC | PRN
  Administered 2016-04-08 (×2): 25 ug via INTRAVENOUS

## 2016-04-08 MED ORDER — BRIMONIDINE TARTRATE 0.2 % OP SOLN
OPHTHALMIC | Status: DC | PRN
  Administered 2016-04-08: 1 [drp] via OPHTHALMIC

## 2016-04-08 MED ORDER — EPINEPHRINE PF 1 MG/ML IJ SOLN
INTRAOCULAR | Status: DC | PRN
  Administered 2016-04-08: 157 mL via OPHTHALMIC

## 2016-04-08 MED ORDER — MOXIFLOXACIN HCL 0.5 % OP SOLN
1.0000 [drp] | OPHTHALMIC | Status: DC | PRN
  Administered 2016-04-08 (×3): 1 [drp] via OPHTHALMIC

## 2016-04-08 MED ORDER — ARMC OPHTHALMIC DILATING DROPS
1.0000 "application " | OPHTHALMIC | Status: DC | PRN
  Administered 2016-04-08 (×3): 1 via OPHTHALMIC

## 2016-04-08 MED ORDER — MIDAZOLAM HCL 2 MG/2ML IJ SOLN
INTRAMUSCULAR | Status: DC | PRN
  Administered 2016-04-08 (×2): 1 mg via INTRAVENOUS

## 2016-04-08 MED ORDER — CEFUROXIME OPHTHALMIC INJECTION 1 MG/0.1 ML
INJECTION | OPHTHALMIC | Status: DC | PRN
  Administered 2016-04-08: .2 mL via OPHTHALMIC

## 2016-04-08 SURGICAL SUPPLY — 23 items
APPLICATOR COTTON TIP 3IN (MISCELLANEOUS) ×2 IMPLANT
CANNULA ANT/CHMB 27GA (MISCELLANEOUS) ×2 IMPLANT
DISSECTOR HYDRO NUCLEUS 50X22 (MISCELLANEOUS) ×2 IMPLANT
GLOVE BIO SURGEON STRL SZ7 (GLOVE) ×2 IMPLANT
GLOVE SURG LX 6.5 MICRO (GLOVE) ×1
GLOVE SURG LX STRL 6.5 MICRO (GLOVE) ×1 IMPLANT
GOWN STRL REUS W/ TWL LRG LVL3 (GOWN DISPOSABLE) ×2 IMPLANT
GOWN STRL REUS W/TWL LRG LVL3 (GOWN DISPOSABLE) ×2
LENS IOL ACRSF IQ ULTRA 17.5 (Intraocular Lens) ×1 IMPLANT
LENS IOL ACRYSOF IQ 17.5 (Intraocular Lens) ×2 IMPLANT
MARKER SKIN DUAL TIP RULER LAB (MISCELLANEOUS) ×2 IMPLANT
PACK CATARACT BRASINGTON (MISCELLANEOUS) ×2 IMPLANT
PACK EYE AFTER SURG (MISCELLANEOUS) ×2 IMPLANT
PACK OPTHALMIC (MISCELLANEOUS) ×2 IMPLANT
RING MALYGIN 7.0 (MISCELLANEOUS) IMPLANT
SUT ETHILON 10-0 CS-B-6CS-B-6 (SUTURE)
SUT VICRYL  9 0 (SUTURE)
SUT VICRYL 9 0 (SUTURE) IMPLANT
SUTURE EHLN 10-0 CS-B-6CS-B-6 (SUTURE) IMPLANT
SYR TB 1ML LUER SLIP (SYRINGE) ×2 IMPLANT
WATER STERILE IRR 250ML POUR (IV SOLUTION) ×2 IMPLANT
WICK EYE OCUCEL (MISCELLANEOUS) IMPLANT
WIPE NON LINTING 3.25X3.25 (MISCELLANEOUS) ×2 IMPLANT

## 2016-04-08 NOTE — Anesthesia Preprocedure Evaluation (Signed)
Anesthesia Evaluation  Patient identified by MRN, date of birth, ID band Patient awake    Reviewed: Allergy & Precautions, H&P , NPO status , Patient's Chart, lab work & pertinent test results, reviewed documented beta blocker date and time   Airway Mallampati: II  TM Distance: >3 FB Neck ROM: full    Dental no notable dental hx.    Pulmonary neg pulmonary ROS,    Pulmonary exam normal breath sounds clear to auscultation       Cardiovascular Exercise Tolerance: Good hypertension,  Rhythm:regular Rate:Normal     Neuro/Psych negative neurological ROS  negative psych ROS   GI/Hepatic negative GI ROS, Neg liver ROS,   Endo/Other  negative endocrine ROS  Renal/GU negative Renal ROS  negative genitourinary   Musculoskeletal   Abdominal   Peds  Hematology negative hematology ROS (+)   Anesthesia Other Findings   Reproductive/Obstetrics negative OB ROS                             Anesthesia Physical Anesthesia Plan  ASA: II  Anesthesia Plan: MAC   Post-op Pain Management:    Induction:   Airway Management Planned:   Additional Equipment:   Intra-op Plan:   Post-operative Plan:   Informed Consent: I have reviewed the patients History and Physical, chart, labs and discussed the procedure including the risks, benefits and alternatives for the proposed anesthesia with the patient or authorized representative who has indicated his/her understanding and acceptance.   Dental Advisory Given  Plan Discussed with: CRNA  Anesthesia Plan Comments:         Anesthesia Quick Evaluation  

## 2016-04-08 NOTE — Op Note (Signed)
Date of Surgery: 04/08/2016  PREOPERATIVE DIAGNOSES: Visually significant nuclear sclerotic cataract, right eye.  POSTOPERATIVE DIAGNOSES: Same  PROCEDURES PERFORMED: Cataract extraction with intraocular lens implant, right eye.  SURGEON: Almon Hercules, M.D.  ANESTHESIA: MAC and topical  IMPLANTS: [id number] +17.5 D  Implant Name Type Inv. Item Serial No. Manufacturer Lot No. LRB No. Used  LENS IOL ACRYSOF IQ 17.5 - [id number] Intraocular Lens LENS IOL ACRYSOF IQ 17.5 [id number] [employer]   Right 1     COMPLICATIONS: None.  DESCRIPTION OF PROCEDURE: Therapeutic options were discussed with the patient preoperatively, including a discussion of risks and benefits of surgery. Informed consent was obtained. An IOL-Master and immersion biometry were used to take the lens measurements, and a dilated fundus exam was performed within 6 months of the surgical date.  The patient was premedicated and brought to the operating room and placed on the operating table in the supine position. After adequate anesthesia, the patient was prepped and draped in the usual sterile ophthalmic fashion. A wire lid speculum was inserted and the microscope was positioned. A Superblade was used to create a paracentesis site at the limbus and a small amount of dilute preservative free lidocaine was instilled into the anterior chamber, followed by dispersive viscoelastic. A clear corneal incision was created temporally using a 2.4 mm keratome blade. Capsulorrhexis was then performed. In situ phacoemulsification was performed.  Cortical material was removed with the irrigation-aspiration unit. Dispersive viscoelastic was instilled to open the capsular bag. A posterior chamber intraocular lens with the specifications above was inserted and positioned. Irrigation-aspiration was used to remove all viscoelastic. Cefuroxime 1cc was instilled into the anterior chamber, and the corneal incision was checked and found to be water tight. The  eyelid speculum was removed.  The operative eye was covered with protective goggles after instilling 1 drop of timolol and brimonidine. The patient tolerated the procedure well. There were no complications.

## 2016-04-08 NOTE — Anesthesia Postprocedure Evaluation (Signed)
Anesthesia Post Note  Patient: Barbara Berg  Procedure(s) Performed: Procedure(s) (LRB): CATARACT EXTRACTION PHACO AND INTRAOCULAR LENS PLACEMENT (IOC)  Right (Right)  Patient location during evaluation: PACU Anesthesia Type: MAC Level of consciousness: awake and alert Pain management: pain level controlled Vital Signs Assessment: post-procedure vital signs reviewed and stable Respiratory status: spontaneous breathing, nonlabored ventilation and respiratory function stable Cardiovascular status: stable Anesthetic complications: no    Veda Canning

## 2016-04-08 NOTE — Transfer of Care (Signed)
Immediate Anesthesia Transfer of Care Note  Patient: Barbara Berg  Procedure(s) Performed: Procedure(s): CATARACT EXTRACTION PHACO AND INTRAOCULAR LENS PLACEMENT (IOC)  Right (Right)  Patient Location: PACU  Anesthesia Type: MAC  Level of Consciousness: awake, alert  and patient cooperative  Airway and Oxygen Therapy: Patient Spontanous Breathing and Patient connected to supplemental oxygen  Post-op Assessment: Post-op Vital signs reviewed, Patient's Cardiovascular Status Stable, Respiratory Function Stable, Patent Airway and No signs of Nausea or vomiting  Post-op Vital Signs: Reviewed and stable  Complications: No apparent anesthesia complications

## 2016-04-08 NOTE — H&P (Signed)
H+P reviewed and is up to date, please see paper chart.  

## 2016-04-09 ENCOUNTER — Encounter: Payer: Self-pay | Admitting: Ophthalmology

## 2016-07-29 ENCOUNTER — Other Ambulatory Visit: Payer: Self-pay | Admitting: Family Medicine

## 2016-07-29 DIAGNOSIS — E210 Primary hyperparathyroidism: Secondary | ICD-10-CM
